# Patient Record
Sex: Male | Born: 1975 | Race: White | Hispanic: No | Marital: Married | Smoking: Current every day smoker
Health system: Southern US, Community
[De-identification: ages and names within clinical notes are randomized; demographics above are authoritative.]

## PROBLEM LIST (undated history)

## (undated) DIAGNOSIS — F419 Anxiety disorder, unspecified: Secondary | ICD-10-CM

---

## 2003-07-20 ENCOUNTER — Emergency Department (HOSPITAL_COMMUNITY): Admission: EM | Admit: 2003-07-20 | Discharge: 2003-07-20 | Payer: Self-pay | Admitting: Emergency Medicine

## 2004-10-17 ENCOUNTER — Encounter: Admission: RE | Admit: 2004-10-17 | Discharge: 2004-10-17 | Payer: Self-pay | Admitting: Allergy and Immunology

## 2005-10-12 IMAGING — CT CT PARANASAL SINUSES LIMITED
1 series · 16 of 28 positions shown, 20 images · IV contrast (agent unspecified)
Comparison: none

CLINICAL DATA: Chronic sinusitis symptoms. 
LIMITED PARANASAL SINUS CT ? NO CONTRAST:
TECHNIQUE: Selected direct coronal images were obtained through the major paranasal sinuses with no IV contrast nor comparison.

[Series 2: — · axial · 0.33mm/px · z∈[+37,+138]mm · 16 of 28 slices shown, 20 images]
[im 2/28  brain]
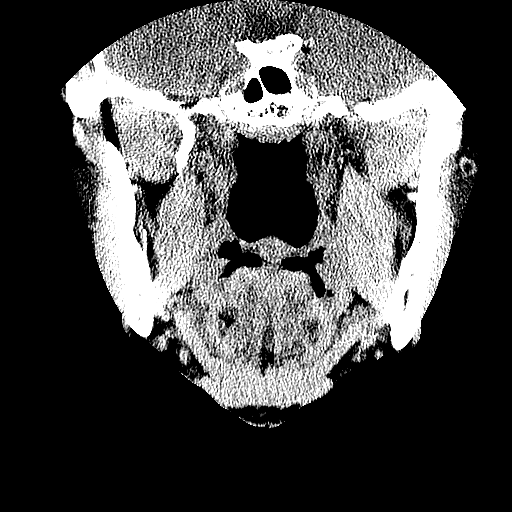
[im 2/28  bone]
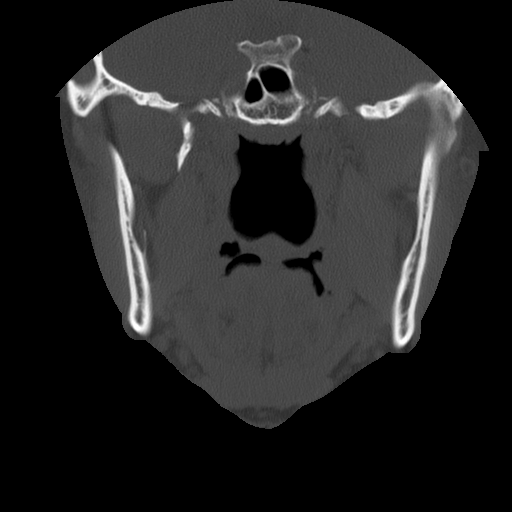
[im 4/28  bone]
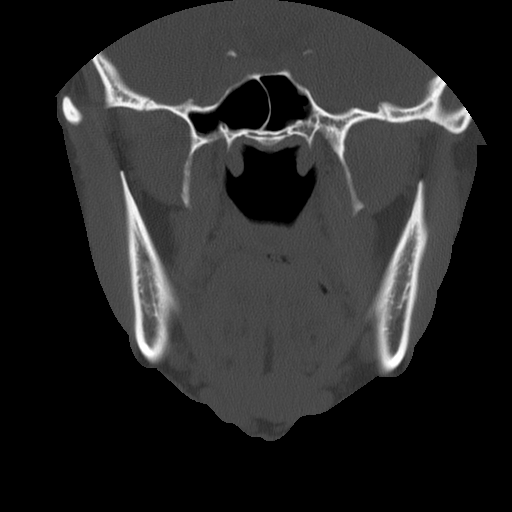
[im 6/28  bone]
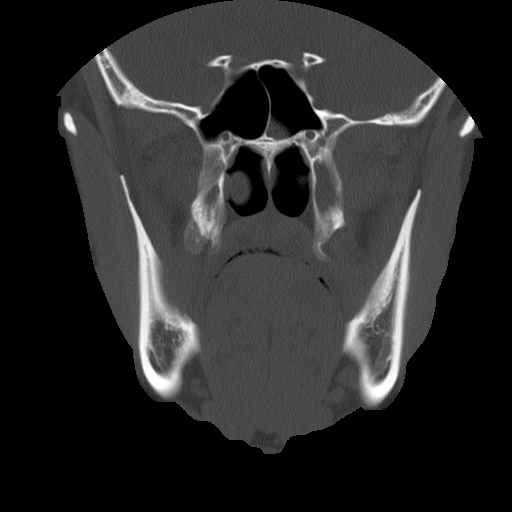
[im 7/28  bone]
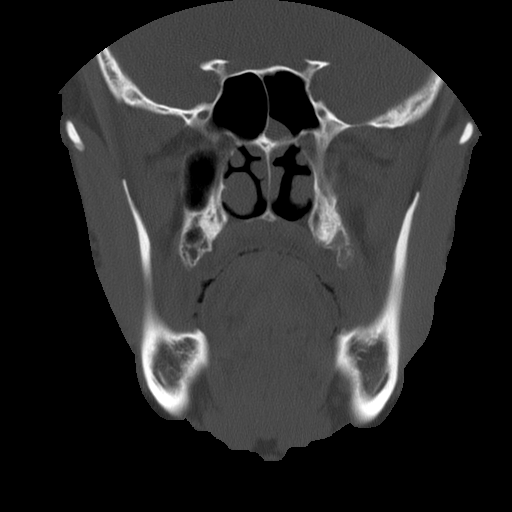
[im 9/28  brain]
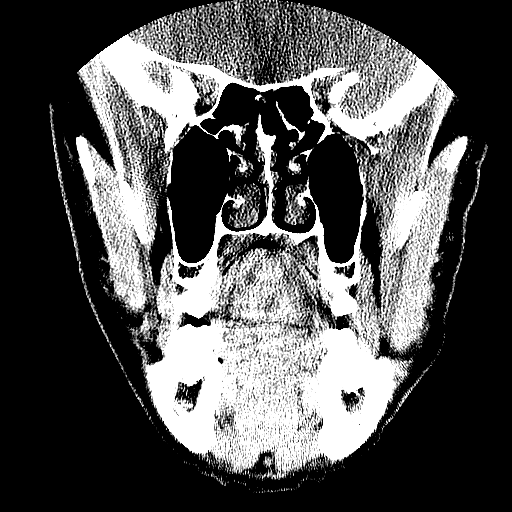
[im 9/28  bone]
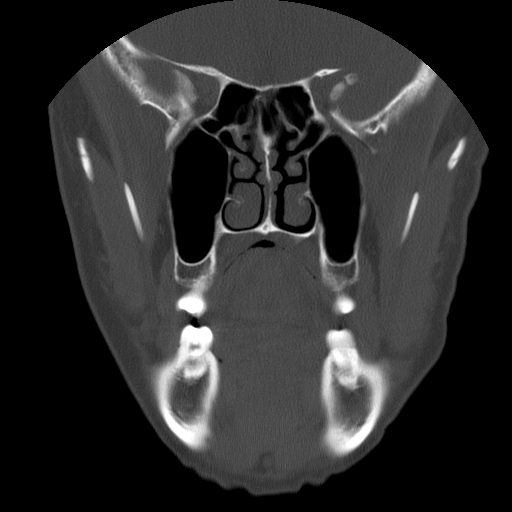
[im 10/28  bone]
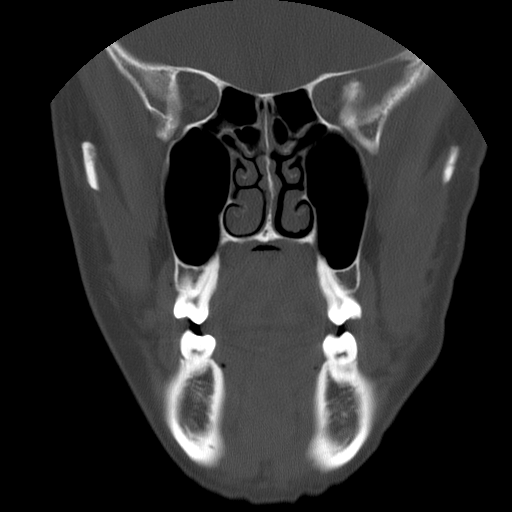
[im 12/28  bone]
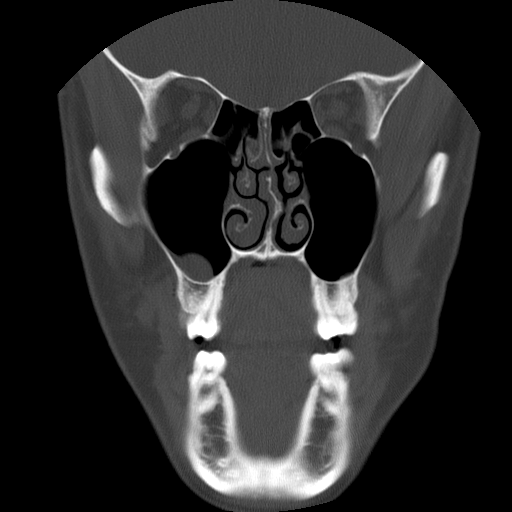
[im 14/28  bone]
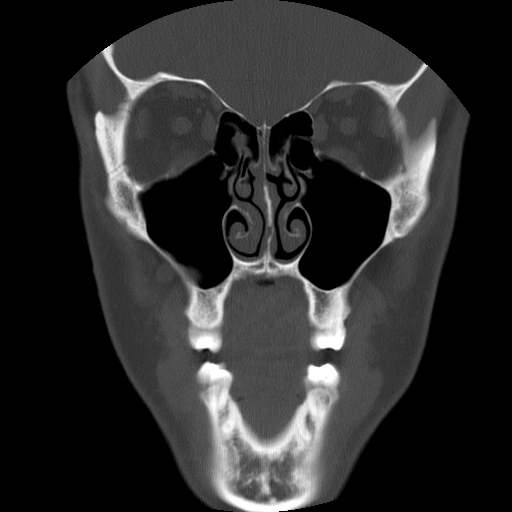
[im 15/28  brain]
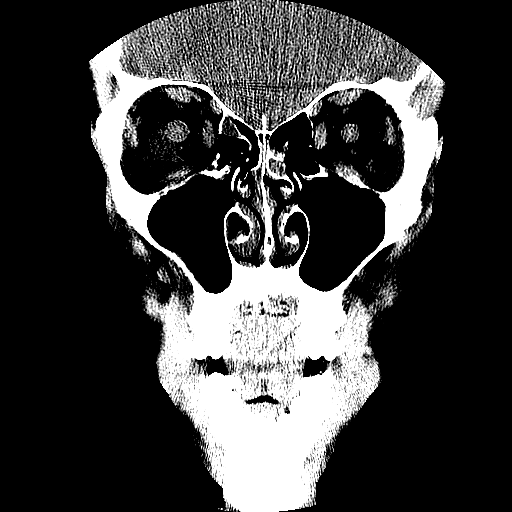
[im 15/28  bone]
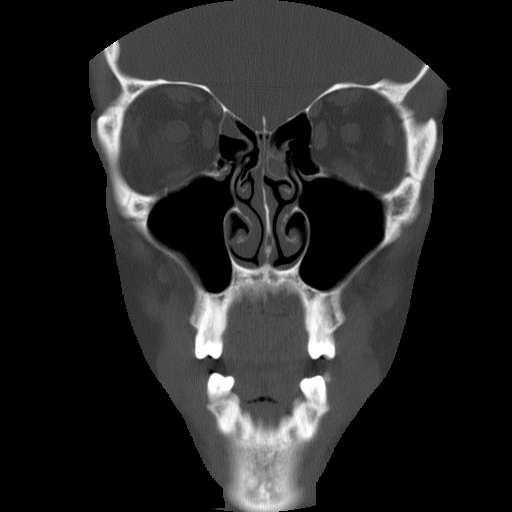
[im 17/28  bone]
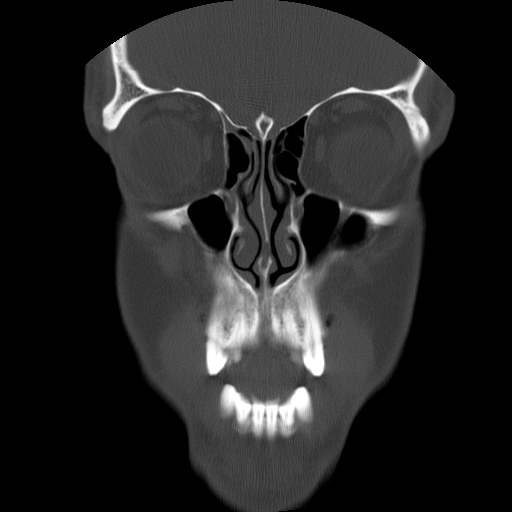
[im 19/28  bone]
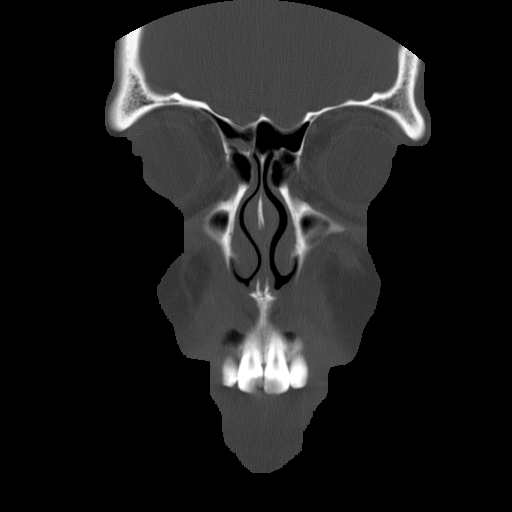
[im 20/28  bone]
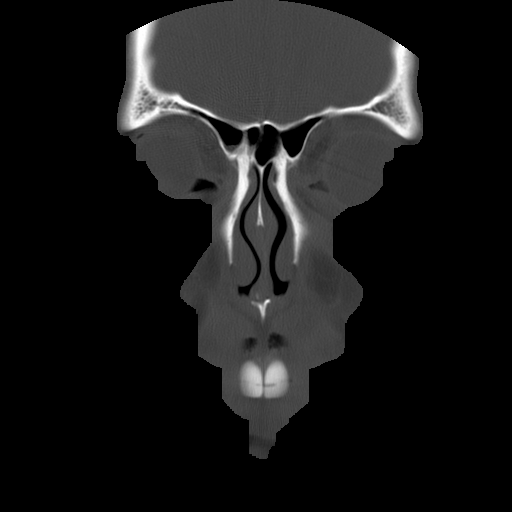
[im 22/28  brain]
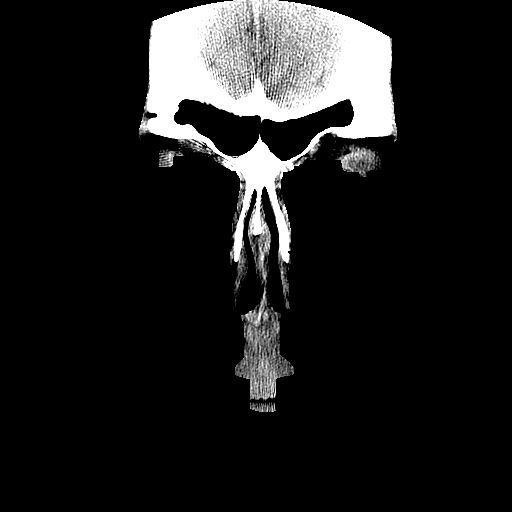
[im 22/28  bone]
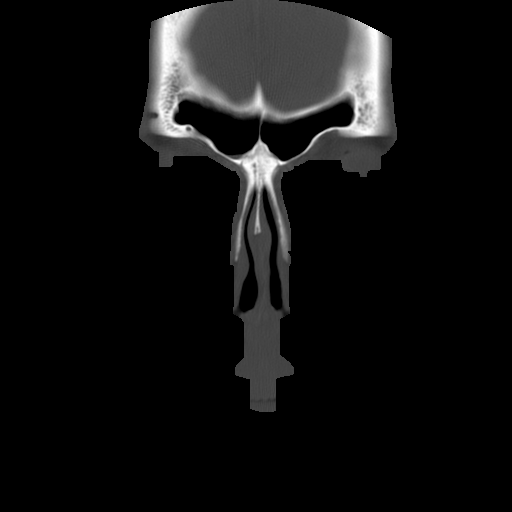
[im 23/28  bone]
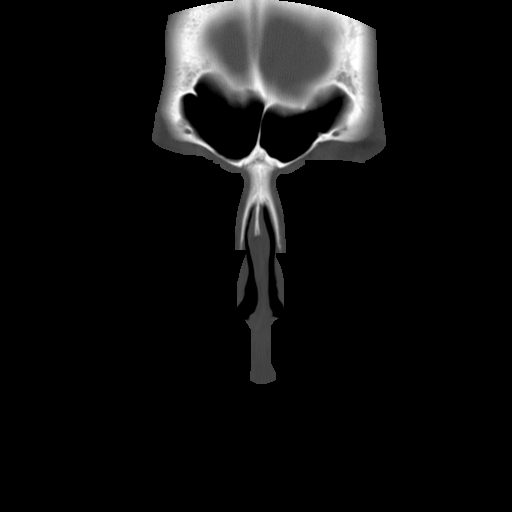
[im 25/28  bone]
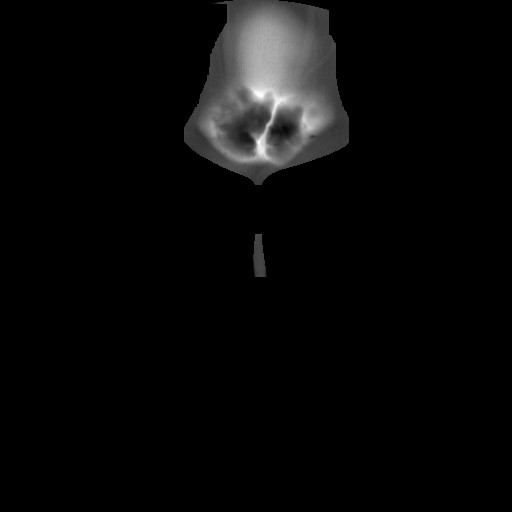
[im 27/28  bone]
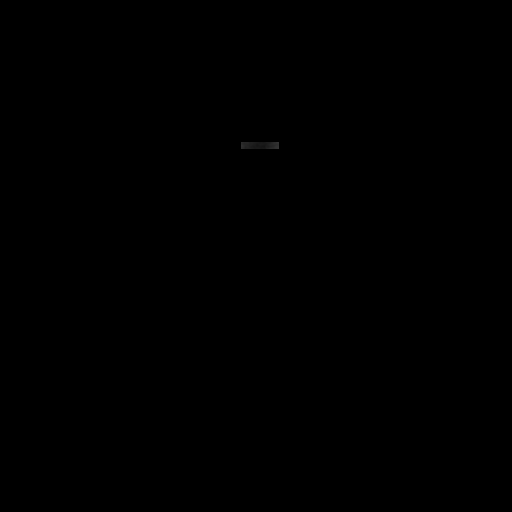

[16 of 28 positions shown; findings below may reference images not displayed]

FINDINGS: Mild inferior left frontal, bilateral ethmoid sinus mucosal thickening is seen with small subcentimeter mucous retention cysts at the inferior left maxillary and inferior right sphenoid sinuses.  The paranasal sinuses are otherwise essentially clear.  Slight rightward nasal septal deviation is seen with mild inferior nasal cavity mucosal thickening and inflammatory enlargement especially of the left inferior nasal turbinate.  No other significant abnormality is seen.
IMPRESSION: 1.  Minimal chronic paranasal sinusitis findings as described with the paranasal sinuses otherwise essentially clear.  
2.  Slight rightward nasal septal deviation with mild inferior rhinitis.  
3.  Otherwise negative.

## 2006-07-20 ENCOUNTER — Ambulatory Visit (HOSPITAL_BASED_OUTPATIENT_CLINIC_OR_DEPARTMENT_OTHER): Admission: RE | Admit: 2006-07-20 | Discharge: 2006-07-20 | Payer: Self-pay | Admitting: Otolaryngology

## 2018-05-08 ENCOUNTER — Emergency Department (HOSPITAL_BASED_OUTPATIENT_CLINIC_OR_DEPARTMENT_OTHER)
Admission: EM | Admit: 2018-05-08 | Discharge: 2018-05-09 | Disposition: A | Discharge status: Home / Self Care | Payer: BLUE CROSS/BLUE SHIELD | Attending: Emergency Medicine | Admitting: Emergency Medicine

## 2018-05-08 ENCOUNTER — Other Ambulatory Visit: Payer: Self-pay

## 2018-05-08 ENCOUNTER — Encounter (HOSPITAL_BASED_OUTPATIENT_CLINIC_OR_DEPARTMENT_OTHER): Payer: Self-pay | Admitting: *Deleted

## 2018-05-08 DIAGNOSIS — R109 Unspecified abdominal pain: Secondary | ICD-10-CM | POA: Diagnosis present

## 2018-05-08 DIAGNOSIS — T6111XA Scombroid fish poisoning, accidental (unintentional), initial encounter: Secondary | ICD-10-CM | POA: Diagnosis not present

## 2018-05-08 DIAGNOSIS — F419 Anxiety disorder, unspecified: Secondary | ICD-10-CM | POA: Insufficient documentation

## 2018-05-08 DIAGNOSIS — F17210 Nicotine dependence, cigarettes, uncomplicated: Secondary | ICD-10-CM | POA: Diagnosis not present

## 2018-05-08 HISTORY — DX: Anxiety disorder, unspecified: F41.9

## 2018-05-08 LAB — COMPREHENSIVE METABOLIC PANEL
ALBUMIN: 4.3 g/dL (ref 3.5–5.0)
ALT: 30 U/L (ref 0–44)
ANION GAP: 10 (ref 5–15)
AST: 21 U/L (ref 15–41)
Alkaline Phosphatase: 49 U/L (ref 38–126)
BUN: 25 mg/dL — ABNORMAL HIGH (ref 6–20)
CO2: 25 mmol/L (ref 22–32)
Calcium: 9.1 mg/dL (ref 8.9–10.3)
Chloride: 102 mmol/L (ref 98–111)
Creatinine, Ser: 1.08 mg/dL (ref 0.61–1.24)
GFR calc non Af Amer: >60 mL/min (ref >60)
GLUCOSE: 117 mg/dL — AB (ref 70–99)
POTASSIUM: 3.3 mmol/L — AB (ref 3.5–5.1)
SODIUM: 137 mmol/L (ref 135–145)
Total Bilirubin: 0.6 mg/dL (ref 0.3–1.2)
Total Protein: 7.5 g/dL (ref 6.5–8.1)

## 2018-05-08 LAB — CBC WITH DIFFERENTIAL/PLATELET
Abs Immature Granulocytes: 0.06 10*3/uL (ref 0.00–0.07)
BASOS ABS: 0 10*3/uL (ref 0.0–0.1)
Basophils Relative: 0 %
EOS ABS: 0.2 10*3/uL (ref 0.0–0.5)
Eosinophils Relative: 1 %
HCT: 45.7 % (ref 39.0–52.0)
Hemoglobin: 14.8 g/dL (ref 13.0–17.0)
IMMATURE GRANULOCYTES: 1 %
LYMPHS ABS: 1.7 10*3/uL (ref 0.7–4.0)
LYMPHS PCT: 14 %
MCH: 28.1 pg (ref 26.0–34.0)
MCHC: 32.4 g/dL (ref 30.0–36.0)
MCV: 86.7 fL (ref 80.0–100.0)
Monocytes Absolute: 0.9 10*3/uL (ref 0.1–1.0)
Monocytes Relative: 7 %
NEUTROS PCT: 77 %
NRBC: 0 % (ref 0.0–0.2)
Neutro Abs: 9.7 10*3/uL — ABNORMAL HIGH (ref 1.7–7.7)
Platelets: 324 10*3/uL (ref 150–400)
RBC: 5.27 MIL/uL (ref 4.22–5.81)
RDW: 13.7 % (ref 11.5–15.5)
WBC: 12.6 10*3/uL — AB (ref 4.0–10.5)

## 2018-05-08 LAB — LIPASE, BLOOD: Lipase: 29 U/L (ref 11–51)

## 2018-05-08 MED ORDER — SODIUM CHLORIDE 0.9 % IV BOLUS (SEPSIS)
1000.0000 mL | Freq: Once | INTRAVENOUS | Status: AC
  Administered 2018-05-08: 1000 mL via INTRAVENOUS

## 2018-05-08 MED ORDER — ONDANSETRON 4 MG PO TBDP: 4.0000 mg | ORAL_TABLET | ORAL | 0 refills | Status: AC | PRN

## 2018-05-08 MED ORDER — ONDANSETRON HCL 4 MG/2ML IJ SOLN
4.0000 mg | Freq: Once | INTRAMUSCULAR | Status: AC
  Administered 2018-05-08: 4 mg via INTRAVENOUS
  Filled 2018-05-08: qty 2

## 2018-05-08 MED ORDER — FAMOTIDINE 20 MG PO TABS: 20.0000 mg | ORAL_TABLET | Freq: Two times a day (BID) | ORAL | 0 refills | Status: AC

## 2018-05-08 MED ORDER — SODIUM CHLORIDE 0.9 % IV SOLN
1000.0000 mL | INTRAVENOUS | Status: DC
Start: 2018-05-08 — End: 2018-05-09

## 2018-05-08 MED ORDER — DIPHENHYDRAMINE HCL 50 MG/ML IJ SOLN
25.0000 mg | Freq: Once | INTRAMUSCULAR | Status: AC
  Administered 2018-05-08: 25 mg via INTRAVENOUS
  Filled 2018-05-08: qty 1

## 2018-05-08 MED ORDER — METHYLPREDNISOLONE SODIUM SUCC 125 MG IJ SOLR
125.0000 mg | Freq: Once | INTRAMUSCULAR | Status: AC
  Administered 2018-05-08: 125 mg via INTRAVENOUS
  Filled 2018-05-08: qty 2

## 2018-05-08 MED ORDER — FAMOTIDINE IN NACL 20-0.9 MG/50ML-% IV SOLN
20.0000 mg | Freq: Once | INTRAVENOUS | Status: AC
  Administered 2018-05-08: 20 mg via INTRAVENOUS
  Filled 2018-05-08: qty 50

## 2018-05-08 MED ORDER — DIPHENHYDRAMINE HCL 25 MG PO TABS: ORAL_TABLET | ORAL | 0 refills | Status: AC

## 2018-05-08 NOTE — ED Notes (Signed)
Patient denies itching 

## 2018-05-08 NOTE — ED Triage Notes (Signed)
Pt reports having diarrhea after eating tuna tonight and chest became very red. Sx started around 630pm. Redness is resolving at this time

## 2018-05-09 NOTE — ED Provider Notes (Signed)
[facility] EMERGENCY DEPARTMENT Provider Note   CSN: [id number] Arrival date & time: 05/08/18  2008     History   Chief Complaint Chief Complaint  Patient presents with  . Allergic Reaction    HPI Adam Small is a 42 y.o. male.  HPI Patient ate tuna this evening.  Patient's wife reports that they frequently have that at home for dinner.  She reports that they all ate tuna however there were separate tuna steaks.  Within about 30 minutes of eating the patient suddenly developed intense cramping in the abdomen with nausea and vomiting.  Started having diarrhea.  His chest and upper body became very red.  He started to feel lightheaded.  He denies difficulty breathing.  He reports he does have a general headache.  He denies any paresthesias or extremity weakness.  He denies ever having had allergic reaction to fish or anything else previously.  He was well before he ate this evening. Past Medical History:  Diagnosis Date  . Anxiety     There are no active problems to display for this patient.   History reviewed. No pertinent surgical history.      Home Medications    Prior to Admission medications   Medication Sig Start Date End Date Taking? Authorizing Provider  diphenhydrAMINE (BENADRYL) 25 MG tablet Take 1 to 2 tablets of Benadryl every 6 hours for the next 2 days.  Then take as needed. 05/08/18   Arby Barrette, MD  famotidine (PEPCID) 20 MG tablet Take 1 tablet (20 mg total) by mouth 2 (two) times daily. Take 2 times daily for the next 2 weeks. 05/08/18   Arby Barrette, MD  ondansetron (ZOFRAN ODT) 4 MG disintegrating tablet Take 1 tablet (4 mg total) by mouth every 4 (four) hours as needed for nausea or vomiting. 05/08/18   Arby Barrette, MD    Family History No family history on file.  Social History Social History   Tobacco Use  . Smoking status: Current Every Day Smoker    Types: Cigarettes  . Smokeless tobacco: Current User    Types: Snuff    Substance Use Topics  . Alcohol use: Yes    Comment: rare  . Drug use: Never     Allergies   Codeine and Naproxen   Review of Systems Review of Systems 10 Systems reviewed and are negative for acute change except as noted in the HPI.   Physical Exam Updated Vital Signs BP 104/69   Pulse 83   Temp 98.1 F (36.7 C) (Oral)   Resp 20   Ht 6\' 2"  (1.88 m)   Wt 104.2 kg   SpO2 93%   BMI 29.49 kg/m   Physical Exam  Constitutional: He is oriented to person, place, and time.  Patient is alert and appropriate.  He appears uncomfortable and mildly ill.  He is slightly pale.  HENT:  Head: Normocephalic and atraumatic.  Mouth/Throat: Oropharynx is clear and moist.  Eyes: Conjunctivae and EOM are normal.  Neck: Neck supple.  Cardiovascular: Normal rate, regular rhythm, normal heart sounds and intact distal pulses.  Pulmonary/Chest: Effort normal and breath sounds normal.  Abdominal:  Abdomen is soft.  Patient denies pain but reports that palpation makes him feel more nauseated.  Musculoskeletal: Normal range of motion. He exhibits no edema or tenderness.  Neurological: He is alert and oriented to person, place, and time. No cranial nerve deficit. He exhibits normal muscle tone. Coordination normal.  Skin: Skin is warm  and dry.  Psychiatric: He has a normal mood and affect.     ED Treatments / Results  Labs (all labs ordered are listed, but only abnormal results are displayed) Labs Reviewed  COMPREHENSIVE METABOLIC PANEL - Abnormal; Notable for the following components:      Result Value   Potassium 3.3 (*)    Glucose, Bld 117 (*)    BUN 25 (*)    All other components within normal limits  CBC WITH DIFFERENTIAL/PLATELET - Abnormal; Notable for the following components:   WBC 12.6 (*)    Neutro Abs 9.7 (*)    All other components within normal limits  LIPASE, BLOOD    EKG None  Radiology No results found.  Procedures Procedures (including critical care  time)  Medications Ordered in ED Medications  sodium chloride 0.9 % bolus 1,000 mL (0 mLs Intravenous Stopped 05/08/18 2230)    Followed by  sodium chloride 0.9 % bolus 1,000 mL (0 mLs Intravenous Stopped 05/09/18 0003)    Followed by  0.9 %  sodium chloride infusion (has no administration in time range)  ondansetron (ZOFRAN) injection 4 mg (4 mg Intravenous Given 05/08/18 2142)  diphenhydrAMINE (BENADRYL) injection 25 mg (25 mg Intravenous Given 05/08/18 2143)  methylPREDNISolone sodium succinate (SOLU-MEDROL) 125 mg/2 mL injection 125 mg (125 mg Intravenous Given 05/08/18 2142)  diphenhydrAMINE (BENADRYL) injection 25 mg (25 mg Intravenous Given 05/08/18 2230)  famotidine (PEPCID) IVPB 20 mg premix (0 mg Intravenous Stopped 05/09/18 0003)     Initial Impression / Assessment and Plan / ED Course  I have reviewed the triage vital signs and the nursing notes.  Pertinent labs & imaging results that were available during my care of the patient were reviewed by me and considered in my medical decision making (see chart for details).    Patient did not have a rash at the time that I saw him.  It had already begun to improve significantly.  He however was having recurrent episodes of intense nausea and cramping abdominal pain having to go to the bathroom.  He felt like he had a generalized headache and felt unwell.  Vital signs stable.  Patient had just eaten a tuna steak at home before the symptoms started.  Findings very consistent with scombroid poisoning.  Patient was treated with Benadryl and Solu-Medrol and Zofran.  Rehydrated with 2 L of fluids.  Patient felt much better upon reassessment.  He wished to go home once symptoms had abated.  He is counseled on return precautions.  Final Clinical Impressions(s) / ED Diagnoses   Final diagnoses:  Unintentional scombroid fish poisoning, initial encounter    ED Discharge Orders         Ordered    diphenhydrAMINE (BENADRYL) 25 MG tablet      05/08/18 2359    famotidine (PEPCID) 20 MG tablet  2 times daily     05/08/18 2359    ondansetron (ZOFRAN ODT) 4 MG disintegrating tablet  Every 4 hours PRN     05/08/18 2359           Arby BarrettePfeiffer, Myisha Pickerel, MD 05/09/18 0007

## 2021-09-04 ENCOUNTER — Encounter (HOSPITAL_BASED_OUTPATIENT_CLINIC_OR_DEPARTMENT_OTHER): Payer: Self-pay | Admitting: Emergency Medicine

## 2021-09-04 ENCOUNTER — Emergency Department (HOSPITAL_BASED_OUTPATIENT_CLINIC_OR_DEPARTMENT_OTHER)
Admission: EM | Admit: 2021-09-04 | Discharge: 2021-09-04 | Disposition: A | Discharge status: Home / Self Care | Payer: BC Managed Care – PPO | Attending: Emergency Medicine | Admitting: Emergency Medicine

## 2021-09-04 ENCOUNTER — Other Ambulatory Visit: Payer: Self-pay

## 2021-09-04 ENCOUNTER — Emergency Department (HOSPITAL_BASED_OUTPATIENT_CLINIC_OR_DEPARTMENT_OTHER): Payer: BC Managed Care – PPO

## 2021-09-04 DIAGNOSIS — R079 Chest pain, unspecified: Secondary | ICD-10-CM

## 2021-09-04 DIAGNOSIS — R42 Dizziness and giddiness: Secondary | ICD-10-CM | POA: Diagnosis not present

## 2021-09-04 DIAGNOSIS — R002 Palpitations: Secondary | ICD-10-CM | POA: Diagnosis not present

## 2021-09-04 DIAGNOSIS — R0789 Other chest pain: Secondary | ICD-10-CM | POA: Diagnosis not present

## 2021-09-04 DIAGNOSIS — F419 Anxiety disorder, unspecified: Secondary | ICD-10-CM | POA: Diagnosis not present

## 2021-09-04 DIAGNOSIS — R059 Cough, unspecified: Secondary | ICD-10-CM | POA: Insufficient documentation

## 2021-09-04 LAB — BASIC METABOLIC PANEL
Anion gap: 8 (ref 5–15)
BUN: 16 mg/dL (ref 6–20)
CO2: 26 mmol/L (ref 22–32)
Calcium: 8.8 mg/dL — ABNORMAL LOW (ref 8.9–10.3)
Chloride: 100 mmol/L (ref 98–111)
Creatinine, Ser: 1.07 mg/dL (ref 0.61–1.24)
GFR, Estimated: >60 mL/min (ref >60)
Glucose, Bld: 93 mg/dL (ref 70–99)
Potassium: 3.9 mmol/L (ref 3.5–5.1)
Sodium: 134 mmol/L — ABNORMAL LOW (ref 135–145)

## 2021-09-04 LAB — CBC
HCT: 46.7 % (ref 39.0–52.0)
Hemoglobin: 15.6 g/dL (ref 13.0–17.0)
MCH: 28.9 pg (ref 26.0–34.0)
MCHC: 33.4 g/dL (ref 30.0–36.0)
MCV: 86.5 fL (ref 80.0–100.0)
Platelets: 361 10*3/uL (ref 150–400)
RBC: 5.4 MIL/uL (ref 4.22–5.81)
RDW: 14 % (ref 11.5–15.5)
WBC: 6.8 10*3/uL (ref 4.0–10.5)
nRBC: 0 % (ref 0.0–0.2)

## 2021-09-04 LAB — TROPONIN I (HIGH SENSITIVITY)
Troponin I (High Sensitivity): <2 ng/L (ref <18)
Troponin I (High Sensitivity): <2 ng/L (ref <18)

## 2021-09-04 NOTE — ED Provider Notes (Signed)
?[facility] EMERGENCY DEPARTMENT ?Provider Note ? ? ?CSN: [id number] ?Arrival date & time: 09/04/21  1151 ? ?  ? ?History ? ?Chief Complaint  ?Patient presents with  ? Chest Pain  ? ? ?JONATHANDAVID MUNKRES is a 46 y.o. male with a past medical history of anxiety presenting today with some chest discomfort.  Says that it felt like "fluttering" in his chest.  Says there was some associated minor pain.  Reports that this usually happens during his anxiety attacks however after 30 minutes the pain was not resolved so he came to the emergency department.  No history of hypertension, hyperlipidemia, does not smoke, no cardiac history in his family.  Reports taking duloxetine every day and Klonopin every night for anxiety.  Denies any associated shortness of breath.  No history of ACS.  The discomfort did not radiate anywhere and he is currently asymptomatic. ? ? ?Chest Pain ?Associated symptoms: cough, dizziness and palpitations   ?Associated symptoms: no nausea and no vomiting   ? ?  ? ?Home Medications ?Prior to Admission medications   ?Medication Sig Start Date End Date Taking? Authorizing Provider  ?diphenhydrAMINE (BENADRYL) 25 MG tablet Take 1 to 2 tablets of Benadryl every 6 hours for the next 2 days.  Then take as needed. 05/08/18   Charlesetta Shanks, MD  ?famotidine (PEPCID) 20 MG tablet Take 1 tablet (20 mg total) by mouth 2 (two) times daily. Take 2 times daily for the next 2 weeks. 05/08/18   Charlesetta Shanks, MD  ?ondansetron (ZOFRAN ODT) 4 MG disintegrating tablet Take 1 tablet (4 mg total) by mouth every 4 (four) hours as needed for nausea or vomiting. 05/08/18   Charlesetta Shanks, MD  ?   ? ?Allergies    ?Codeine and Naproxen   ? ?Review of Systems   ?Review of Systems  ?Respiratory:  Positive for cough and chest tightness.   ?Cardiovascular:  Positive for palpitations. Negative for chest pain.  ?Gastrointestinal:  Negative for nausea and vomiting.  ?Musculoskeletal:  Negative for gait problem.  ?Neurological:   Positive for dizziness.  ?See HPI ? ?Physical Exam ?Updated Vital Signs ?BP 123/77   Pulse 66   Temp 97.9 ?F (36.6 ?C) (Oral)   Resp 19   Ht 6\' 2"  (1.88 m)   Wt 115.7 kg   SpO2 98%   BMI 32.74 kg/m?  ?Physical Exam ?Vitals and nursing note reviewed.  ?Constitutional:   ?   General: He is not in acute distress. ?   Appearance: Normal appearance. He is not ill-appearing.  ?HENT:  ?   Head: Normocephalic and atraumatic.  ?Eyes:  ?   General: No scleral icterus. ?   Conjunctiva/sclera: Conjunctivae normal.  ?Cardiovascular:  ?   Rate and Rhythm: Normal rate and regular rhythm.  ?   Heart sounds: Normal heart sounds.  ?Pulmonary:  ?   Effort: Pulmonary effort is normal. No respiratory distress.  ?   Breath sounds: Normal breath sounds. No wheezing or rhonchi.  ?Skin: ?   General: Skin is warm and dry.  ?   Findings: No rash.  ?Neurological:  ?   Mental Status: He is alert.  ?Psychiatric:     ?   Mood and Affect: Mood normal.  ? ? ?ED Results / Procedures / Treatments   ?Labs ?(all labs ordered are listed, but only abnormal results are displayed) ?Labs Reviewed  ?BASIC METABOLIC PANEL - Abnormal; Notable for the following components:  ?    Result Value  ?  Sodium 134 (*)   ? Calcium 8.8 (*)   ? All other components within normal limits  ?CBC  ?TROPONIN I (HIGH SENSITIVITY)  ?TROPONIN I (HIGH SENSITIVITY)  ? ? ?EKG ?EKG Interpretation ? ?Date/Time:  Wednesday September 04 2021 12:03:23 EDT ?Ventricular Rate:  62 ?PR Interval:  134 ?QRS Duration: 92 ?QT Interval:  408 ?QTC Calculation: 414 ?R Axis:   -18 ?Text Interpretation: Sinus rhythm with marked sinus arrhythmia Cannot rule out Anterior infarct , age undetermined Abnormal ECG No previous ECGs available Confirmed by Gareth Morgan [phone]) on 09/04/2021 3:18:33 PM ? ?Radiology ?DG Chest 2 View ? ?Result Date: 09/04/2021 ?CLINICAL DATA:  Central chest pain beginning today with bilateral arm tingling EXAM: CHEST - 2 VIEW COMPARISON:  None FINDINGS: Cardiomediastinal  silhouette and pulmonary vasculature are within normal limits. Lungs are clear. IMPRESSION: No acute cardiopulmonary process. Electronically Signed   By: Miachel Roux M.D.   On: 09/04/2021 12:44   ? ?Procedures ?Procedures  ? ? ?Medications Ordered in ED ?Medications - No data to display ? ?ED Course/ Medical Decision Making/ A&P ?  ?                        ?Medical Decision Making ?Amount and/or Complexity of Data Reviewed ?Labs: ordered. ?Radiology: ordered. ? ? ?Patient presents to the ED for concern of chest pain. The emergent differential diagnosis of chest pain includes: Acute coronary syndrome, pericarditis, aortic dissection, pulmonary embolism, tension pneumothorax, and esophageal rupture. ?  ?Co morbidities that complicate the patient evaluation include: Anxiety ? ?Per internal/external chart review: Patient has no cardiac related visits or comorbidities. ? ? ?I performed a full physical exam, pertinent findings include: ? ?No pertinent findings ? ?Diagnostics: ? ?I ordered and viewed labs. The pertinent results include:  ?Negative troponin x2 ?Considered D-dimer however patient is PERC negative. ? ?I ordered and individually viewed patient's chest x-ray.  Negative. ? ? ?Cardiac Monitoring: ? ?The patient was maintained on a cardiac monitor.  I personally viewed and interpreted the cardiac monitored which showed normal sinus rhythm with a normal rate ? ? ?Treatment: ? ?Asymptomatic in the department, no medications ordered. ? ? ?MDM/Disposition: ? ? ? ?I reevaluated the patient and he continues to deny any symptoms.  Heart score 0.  I believe patient is stable to be discharged home with follow-up with his PCP.  He is agreeable to this plan.  If he continues to have these symptoms after his anxiety is under control, he may require referral to cardiology.  Will defer to PCP.  Patient is agreeable to this plan ? ? ?Final Clinical Impression(s) / ED Diagnoses ?Final diagnoses:  ?Nonspecific chest pain   ?Anxiety  ? ? ?Rx / DC Orders ? ? ?Results and diagnoses were explained to the patient. Return precautions discussed in full. Patient had no additional questions and expressed complete understanding. ? ? ?This chart was dictated using voice recognition software.  Despite best efforts to proofread,  errors can occur which can change the documentation meaning.  ?  ?Rhae Hammock, PA-C ?09/05/21 1800 ? ?  ?Gareth Morgan, MD ?09/05/21 2221 ? ?

## 2021-09-04 NOTE — ED Provider Notes (Incomplete)
?  [facility] EMERGENCY DEPARTMENT ?Provider Note ? ? ?CSN: [id number] ?Arrival date & time: 09/04/21  1151 ? ?  ? ?History ?{Add pertinent medical, surgical, social history, OB history to HPI:1} ?Chief Complaint  ?Patient presents with  ? Chest Pain  ? ? ?Adam Small is a 46 y.o. male. ? ?11:15 am racing heart, panic attacks ysyally go away ?-duloxetine, 30 ?-clonazopem  ?-cpap ? ? ?Chest Pain ? ?  ? ?Home Medications ?Prior to Admission medications   ?Medication Sig Start Date End Date Taking? Authorizing Provider  ?diphenhydrAMINE (BENADRYL) 25 MG tablet Take 1 to 2 tablets of Benadryl every 6 hours for the next 2 days.  Then take as needed. 05/08/18   Arby Barrette, MD  ?famotidine (PEPCID) 20 MG tablet Take 1 tablet (20 mg total) by mouth 2 (two) times daily. Take 2 times daily for the next 2 weeks. 05/08/18   Arby Barrette, MD  ?ondansetron (ZOFRAN ODT) 4 MG disintegrating tablet Take 1 tablet (4 mg total) by mouth every 4 (four) hours as needed for nausea or vomiting. 05/08/18   Arby Barrette, MD  ?   ? ?Allergies    ?Codeine and Naproxen   ? ?Review of Systems   ?Review of Systems  ?Cardiovascular:  Positive for chest pain.  ? ?Physical Exam ?Updated Vital Signs ?BP (!) 141/87 (BP Location: Left Arm)   Pulse 71   Temp 97.9 ?F (36.6 ?C) (Oral)   Resp 18   Ht 6\' 2"  (1.88 m)   Wt 115.7 kg   SpO2 100%   BMI 32.74 kg/m?  ?Physical Exam ? ?ED Results / Procedures / Treatments   ?Labs ?(all labs ordered are listed, but only abnormal results are displayed) ?Labs Reviewed  ?BASIC METABOLIC PANEL - Abnormal; Notable for the following components:  ?    Result Value  ? Sodium 134 (*)   ? Calcium 8.8 (*)   ? All other components within normal limits  ?CBC  ?TROPONIN I (HIGH SENSITIVITY)  ?TROPONIN I (HIGH SENSITIVITY)  ? ? ?EKG ?None ? ?Radiology ?DG Chest 2 View ? ?Result Date: 09/04/2021 ?CLINICAL DATA:  Central chest pain beginning today with bilateral arm tingling EXAM: CHEST - 2 VIEW COMPARISON:   None FINDINGS: Cardiomediastinal silhouette and pulmonary vasculature are within normal limits. Lungs are clear. IMPRESSION: No acute cardiopulmonary process. Electronically Signed   By: 11/04/2021 M.D.   On: 09/04/2021 12:44   ? ?Procedures ?Procedures  ?{Document cardiac monitor, telemetry assessment procedure when appropriate:1} ? ?Medications Ordered in ED ?Medications - No data to display ? ?ED Course/ Medical Decision Making/ A&P ?  ?                        ?Medical Decision Making ?Amount and/or Complexity of Data Reviewed ?Labs: ordered. ?Radiology: ordered. ? ? ?*** ? ?{Document critical care time when appropriate:1} ?{Document review of labs and clinical decision tools ie heart score, Chads2Vasc2 etc:1}  ?{Document your independent review of radiology images, and any outside records:1} ?{Document your discussion with family members, caretakers, and with consultants:1} ?{Document social determinants of health affecting pt's care:1} ?{Document your decision making why or why not admission, treatments were needed:1} ?Final Clinical Impression(s) / ED Diagnoses ?Final diagnoses:  ?None  ? ? ?Rx / DC Orders ?ED Discharge Orders   ? ? None  ? ?  ? ? ?

## 2021-09-04 NOTE — Discharge Instructions (Signed)
Both of your labs are negative.  Your EKG is okay, please follow-up with primary care.  There is a referral to [place] on these papers.  Continue to take your anxiety medications and return with any worsening symptoms ?

## 2021-09-04 NOTE — ED Triage Notes (Signed)
Central chest pain started today , tingling to bilateral arms , Hx anxiety . No relief despite his meds.  ?Shortness of breath and lightheadedness.  ?

## 2021-09-24 DIAGNOSIS — F411 Generalized anxiety disorder: Secondary | ICD-10-CM | POA: Diagnosis not present

## 2021-09-24 DIAGNOSIS — F341 Dysthymic disorder: Secondary | ICD-10-CM | POA: Diagnosis not present

## 2021-09-24 DIAGNOSIS — G4700 Insomnia, unspecified: Secondary | ICD-10-CM | POA: Diagnosis not present

## 2021-10-17 DIAGNOSIS — G4733 Obstructive sleep apnea (adult) (pediatric): Secondary | ICD-10-CM | POA: Diagnosis not present

## 2021-12-19 DIAGNOSIS — G4733 Obstructive sleep apnea (adult) (pediatric): Secondary | ICD-10-CM | POA: Diagnosis not present

## 2021-12-24 DIAGNOSIS — F411 Generalized anxiety disorder: Secondary | ICD-10-CM | POA: Diagnosis not present

## 2021-12-24 DIAGNOSIS — F341 Dysthymic disorder: Secondary | ICD-10-CM | POA: Diagnosis not present

## 2021-12-24 DIAGNOSIS — Z563 Stressful work schedule: Secondary | ICD-10-CM | POA: Diagnosis not present

## 2021-12-24 DIAGNOSIS — Z566 Other physical and mental strain related to work: Secondary | ICD-10-CM | POA: Diagnosis not present

## 2022-01-17 DIAGNOSIS — G4733 Obstructive sleep apnea (adult) (pediatric): Secondary | ICD-10-CM | POA: Diagnosis not present

## 2022-04-17 DIAGNOSIS — G4733 Obstructive sleep apnea (adult) (pediatric): Secondary | ICD-10-CM | POA: Diagnosis not present

## 2022-04-28 DIAGNOSIS — M545 Low back pain, unspecified: Secondary | ICD-10-CM | POA: Diagnosis not present

## 2022-04-28 DIAGNOSIS — S838X2A Sprain of other specified parts of left knee, initial encounter: Secondary | ICD-10-CM | POA: Diagnosis not present

## 2022-05-09 DIAGNOSIS — M25561 Pain in right knee: Secondary | ICD-10-CM | POA: Diagnosis not present

## 2022-05-09 DIAGNOSIS — M1711 Unilateral primary osteoarthritis, right knee: Secondary | ICD-10-CM | POA: Diagnosis not present

## 2022-05-13 DIAGNOSIS — F331 Major depressive disorder, recurrent, moderate: Secondary | ICD-10-CM | POA: Diagnosis not present

## 2022-05-13 DIAGNOSIS — F411 Generalized anxiety disorder: Secondary | ICD-10-CM | POA: Diagnosis not present

## 2022-05-13 DIAGNOSIS — F410 Panic disorder [episodic paroxysmal anxiety] without agoraphobia: Secondary | ICD-10-CM | POA: Diagnosis not present

## 2022-06-10 DIAGNOSIS — R262 Difficulty in walking, not elsewhere classified: Secondary | ICD-10-CM | POA: Diagnosis not present

## 2022-06-10 DIAGNOSIS — M5440 Lumbago with sciatica, unspecified side: Secondary | ICD-10-CM | POA: Diagnosis not present

## 2022-06-10 DIAGNOSIS — M6281 Muscle weakness (generalized): Secondary | ICD-10-CM | POA: Diagnosis not present

## 2022-06-10 DIAGNOSIS — M25561 Pain in right knee: Secondary | ICD-10-CM | POA: Diagnosis not present

## 2022-06-17 DIAGNOSIS — G4733 Obstructive sleep apnea (adult) (pediatric): Secondary | ICD-10-CM | POA: Diagnosis not present

## 2022-06-24 DIAGNOSIS — M25561 Pain in right knee: Secondary | ICD-10-CM | POA: Diagnosis not present

## 2022-06-24 DIAGNOSIS — M5440 Lumbago with sciatica, unspecified side: Secondary | ICD-10-CM | POA: Diagnosis not present

## 2022-06-24 DIAGNOSIS — R262 Difficulty in walking, not elsewhere classified: Secondary | ICD-10-CM | POA: Diagnosis not present

## 2022-06-24 DIAGNOSIS — M6281 Muscle weakness (generalized): Secondary | ICD-10-CM | POA: Diagnosis not present

## 2022-07-10 DIAGNOSIS — M6281 Muscle weakness (generalized): Secondary | ICD-10-CM | POA: Diagnosis not present

## 2022-07-10 DIAGNOSIS — M25561 Pain in right knee: Secondary | ICD-10-CM | POA: Diagnosis not present

## 2022-07-10 DIAGNOSIS — R262 Difficulty in walking, not elsewhere classified: Secondary | ICD-10-CM | POA: Diagnosis not present

## 2022-07-10 DIAGNOSIS — M5440 Lumbago with sciatica, unspecified side: Secondary | ICD-10-CM | POA: Diagnosis not present

## 2022-07-14 DIAGNOSIS — H1032 Unspecified acute conjunctivitis, left eye: Secondary | ICD-10-CM | POA: Diagnosis not present

## 2022-07-14 DIAGNOSIS — H00019 Hordeolum externum unspecified eye, unspecified eyelid: Secondary | ICD-10-CM | POA: Diagnosis not present

## 2022-07-16 DIAGNOSIS — G4733 Obstructive sleep apnea (adult) (pediatric): Secondary | ICD-10-CM | POA: Diagnosis not present

## 2022-07-17 DIAGNOSIS — M6281 Muscle weakness (generalized): Secondary | ICD-10-CM | POA: Diagnosis not present

## 2022-07-17 DIAGNOSIS — M5440 Lumbago with sciatica, unspecified side: Secondary | ICD-10-CM | POA: Diagnosis not present

## 2022-07-17 DIAGNOSIS — R262 Difficulty in walking, not elsewhere classified: Secondary | ICD-10-CM | POA: Diagnosis not present

## 2022-07-17 DIAGNOSIS — M25561 Pain in right knee: Secondary | ICD-10-CM | POA: Diagnosis not present

## 2022-07-24 DIAGNOSIS — M25561 Pain in right knee: Secondary | ICD-10-CM | POA: Diagnosis not present

## 2022-07-24 DIAGNOSIS — M5440 Lumbago with sciatica, unspecified side: Secondary | ICD-10-CM | POA: Diagnosis not present

## 2022-07-24 DIAGNOSIS — M6281 Muscle weakness (generalized): Secondary | ICD-10-CM | POA: Diagnosis not present

## 2022-07-24 DIAGNOSIS — R262 Difficulty in walking, not elsewhere classified: Secondary | ICD-10-CM | POA: Diagnosis not present

## 2022-07-31 DIAGNOSIS — M25561 Pain in right knee: Secondary | ICD-10-CM | POA: Diagnosis not present

## 2022-07-31 DIAGNOSIS — M5440 Lumbago with sciatica, unspecified side: Secondary | ICD-10-CM | POA: Diagnosis not present

## 2022-07-31 DIAGNOSIS — M6281 Muscle weakness (generalized): Secondary | ICD-10-CM | POA: Diagnosis not present

## 2022-07-31 DIAGNOSIS — R262 Difficulty in walking, not elsewhere classified: Secondary | ICD-10-CM | POA: Diagnosis not present

## 2022-08-12 DIAGNOSIS — M62830 Muscle spasm of back: Secondary | ICD-10-CM | POA: Diagnosis not present

## 2022-08-12 DIAGNOSIS — M545 Low back pain, unspecified: Secondary | ICD-10-CM | POA: Diagnosis not present

## 2022-08-30 IMAGING — CR DG CHEST 2V
2 series · 2 of 2 positions shown · non-contrast
Comparison: None

CLINICAL DATA: Central chest pain beginning today with bilateral
arm tingling

EXAM:
CHEST - 2 VIEW

[w chest pa]
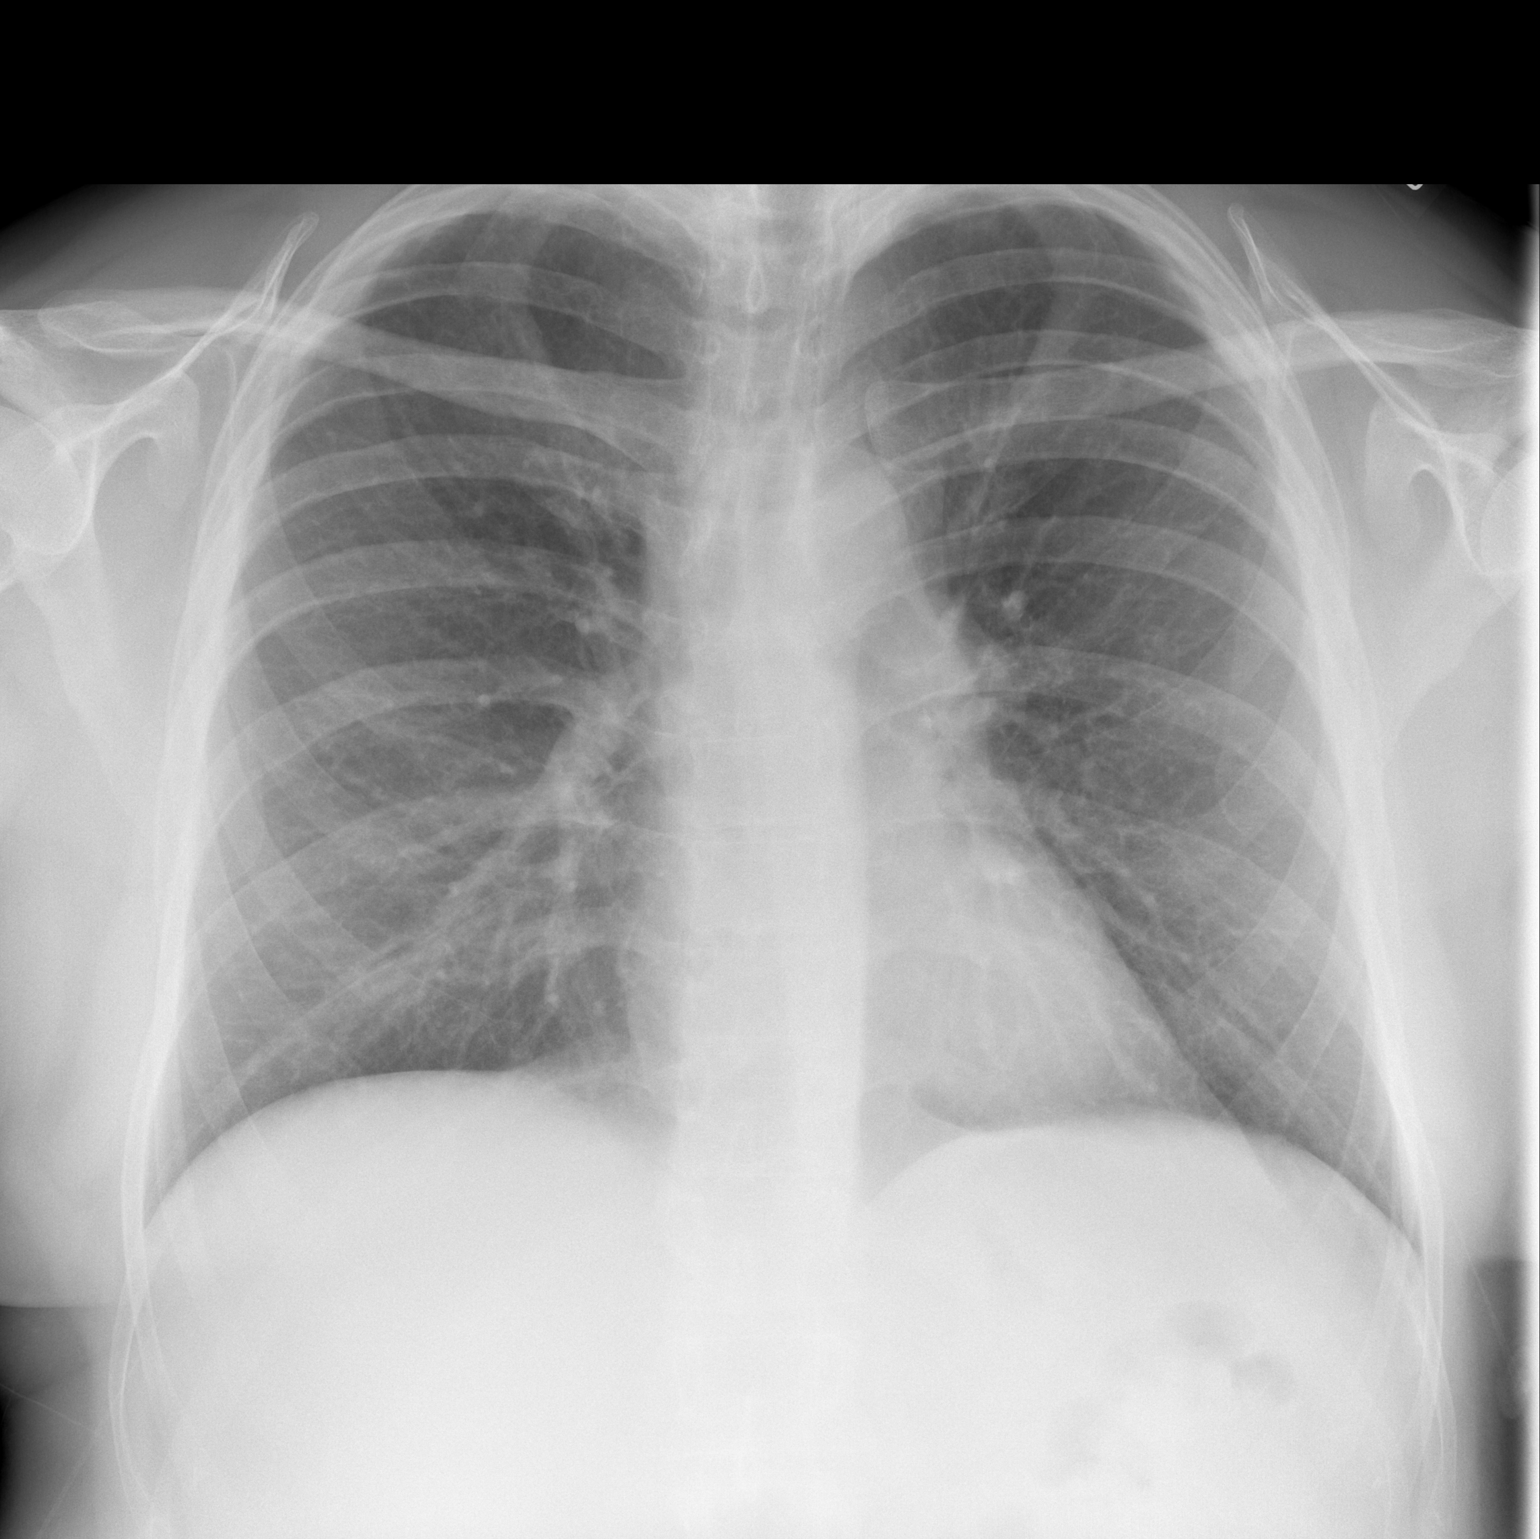

[w chest lat]
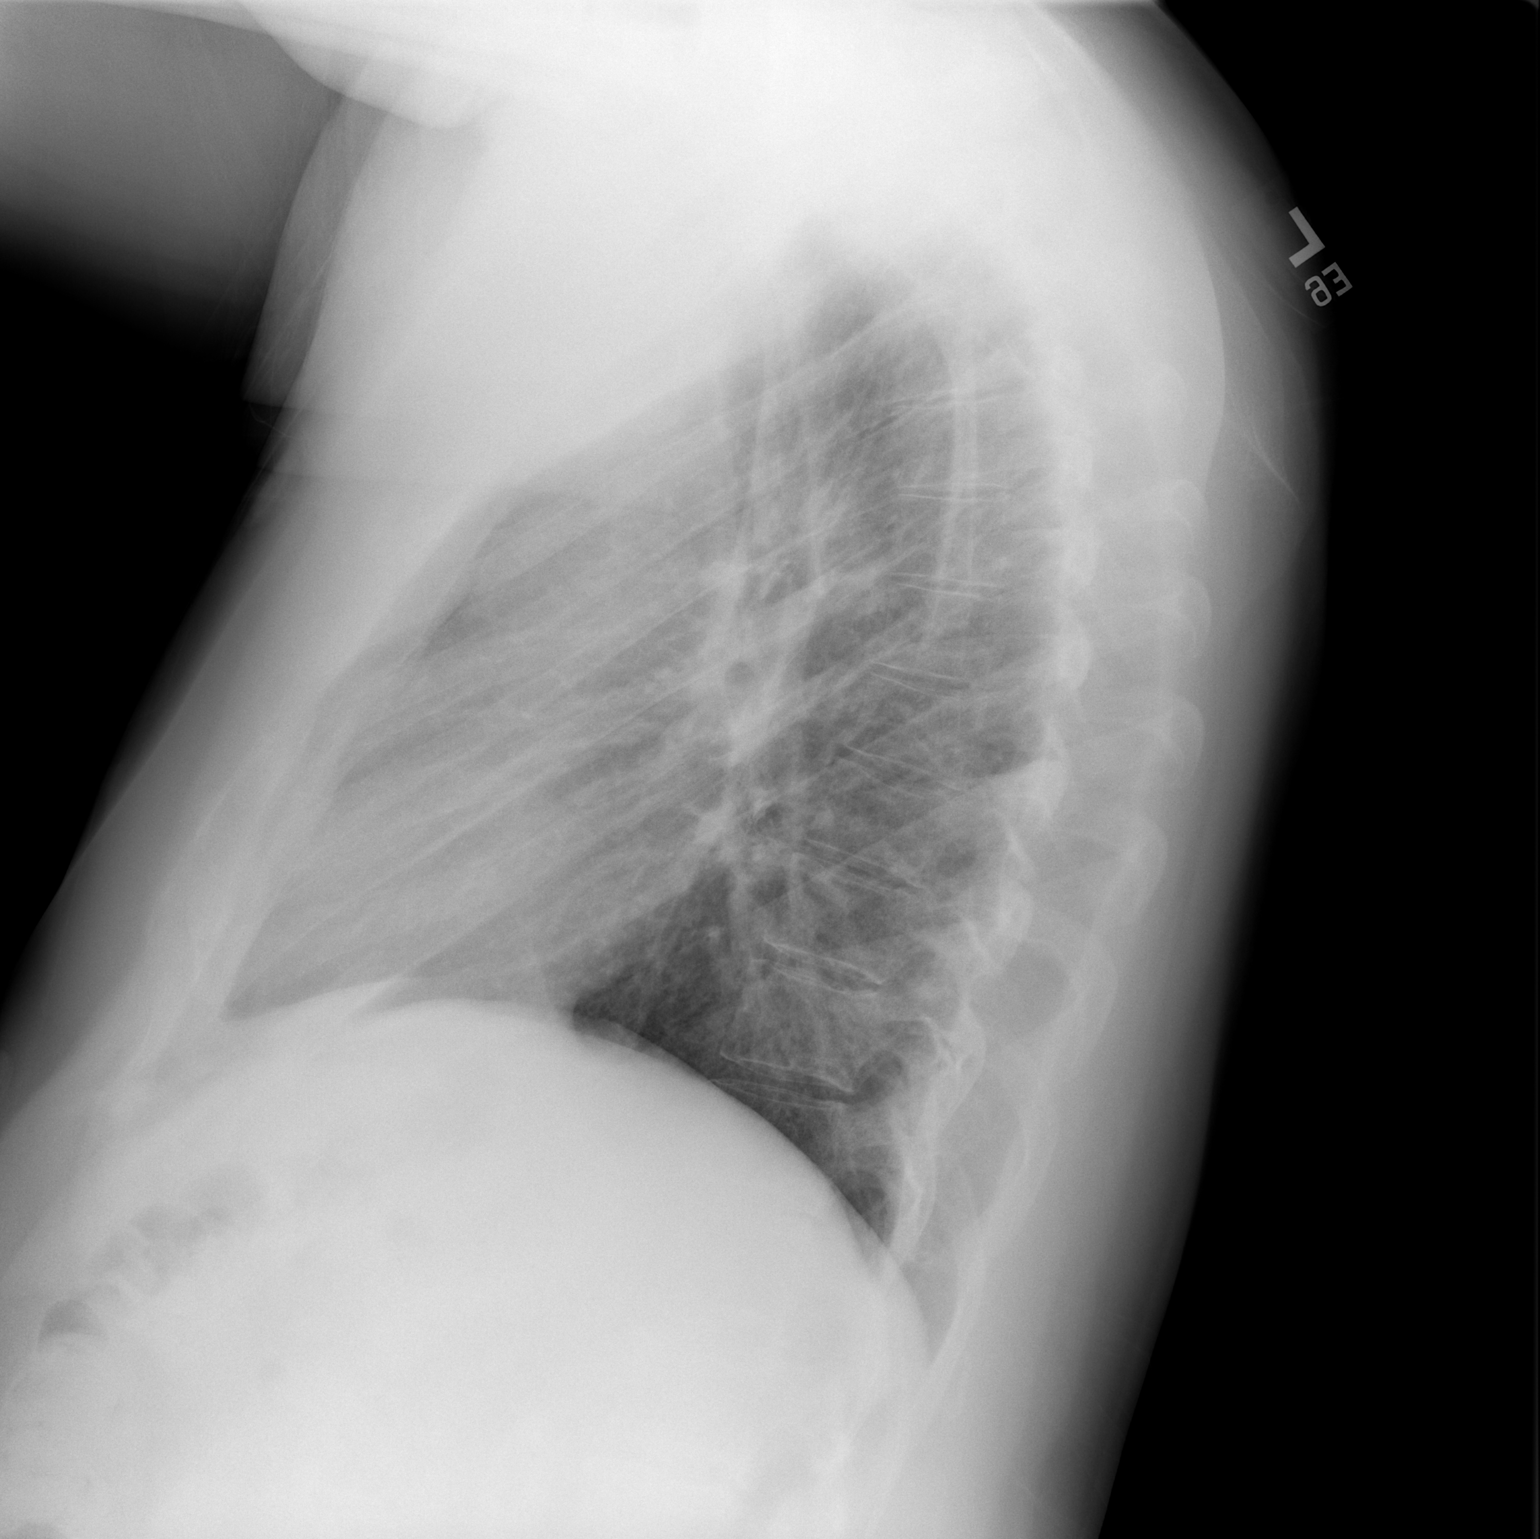

[2 of 2 positions shown; findings below may reference images not displayed]

FINDINGS: Cardiomediastinal silhouette and pulmonary vasculature are within
normal limits.

Lungs are clear.
IMPRESSION: No acute cardiopulmonary process.

## 2022-10-14 DIAGNOSIS — G4733 Obstructive sleep apnea (adult) (pediatric): Secondary | ICD-10-CM | POA: Diagnosis not present

## 2022-11-11 DIAGNOSIS — F411 Generalized anxiety disorder: Secondary | ICD-10-CM | POA: Diagnosis not present

## 2022-11-11 DIAGNOSIS — F331 Major depressive disorder, recurrent, moderate: Secondary | ICD-10-CM | POA: Diagnosis not present

## 2022-11-11 DIAGNOSIS — F410 Panic disorder [episodic paroxysmal anxiety] without agoraphobia: Secondary | ICD-10-CM | POA: Diagnosis not present

## 2022-12-15 DIAGNOSIS — G4733 Obstructive sleep apnea (adult) (pediatric): Secondary | ICD-10-CM | POA: Diagnosis not present

## 2023-01-12 DIAGNOSIS — G4733 Obstructive sleep apnea (adult) (pediatric): Secondary | ICD-10-CM | POA: Diagnosis not present

## 2023-02-17 DIAGNOSIS — F331 Major depressive disorder, recurrent, moderate: Secondary | ICD-10-CM | POA: Diagnosis not present

## 2023-02-17 DIAGNOSIS — F410 Panic disorder [episodic paroxysmal anxiety] without agoraphobia: Secondary | ICD-10-CM | POA: Diagnosis not present

## 2023-02-17 DIAGNOSIS — F5104 Psychophysiologic insomnia: Secondary | ICD-10-CM | POA: Diagnosis not present

## 2023-02-17 DIAGNOSIS — F411 Generalized anxiety disorder: Secondary | ICD-10-CM | POA: Diagnosis not present

## 2023-04-13 DIAGNOSIS — G4733 Obstructive sleep apnea (adult) (pediatric): Secondary | ICD-10-CM | POA: Diagnosis not present

## 2023-06-10 DIAGNOSIS — F411 Generalized anxiety disorder: Secondary | ICD-10-CM | POA: Diagnosis not present

## 2023-06-10 DIAGNOSIS — F331 Major depressive disorder, recurrent, moderate: Secondary | ICD-10-CM | POA: Diagnosis not present

## 2023-06-10 DIAGNOSIS — F5104 Psychophysiologic insomnia: Secondary | ICD-10-CM | POA: Diagnosis not present

## 2023-06-10 DIAGNOSIS — F410 Panic disorder [episodic paroxysmal anxiety] without agoraphobia: Secondary | ICD-10-CM | POA: Diagnosis not present

## 2023-06-15 DIAGNOSIS — G4733 Obstructive sleep apnea (adult) (pediatric): Secondary | ICD-10-CM | POA: Diagnosis not present

## 2023-07-14 DIAGNOSIS — G4733 Obstructive sleep apnea (adult) (pediatric): Secondary | ICD-10-CM | POA: Diagnosis not present

## 2023-07-21 DIAGNOSIS — F411 Generalized anxiety disorder: Secondary | ICD-10-CM | POA: Diagnosis not present

## 2023-07-21 DIAGNOSIS — F331 Major depressive disorder, recurrent, moderate: Secondary | ICD-10-CM | POA: Diagnosis not present

## 2023-07-29 DIAGNOSIS — Z0000 Encounter for general adult medical examination without abnormal findings: Secondary | ICD-10-CM | POA: Diagnosis not present

## 2023-07-29 DIAGNOSIS — F411 Generalized anxiety disorder: Secondary | ICD-10-CM | POA: Diagnosis not present

## 2023-07-29 DIAGNOSIS — E66811 Obesity, class 1: Secondary | ICD-10-CM | POA: Diagnosis not present

## 2023-07-29 DIAGNOSIS — G4733 Obstructive sleep apnea (adult) (pediatric): Secondary | ICD-10-CM | POA: Diagnosis not present

## 2023-07-30 DIAGNOSIS — Z13220 Encounter for screening for lipoid disorders: Secondary | ICD-10-CM | POA: Diagnosis not present

## 2023-07-30 DIAGNOSIS — Z0000 Encounter for general adult medical examination without abnormal findings: Secondary | ICD-10-CM | POA: Diagnosis not present

## 2023-08-10 DIAGNOSIS — G4733 Obstructive sleep apnea (adult) (pediatric): Secondary | ICD-10-CM | POA: Diagnosis not present

## 2023-08-12 DIAGNOSIS — F331 Major depressive disorder, recurrent, moderate: Secondary | ICD-10-CM | POA: Diagnosis not present

## 2023-08-12 DIAGNOSIS — F411 Generalized anxiety disorder: Secondary | ICD-10-CM | POA: Diagnosis not present

## 2023-08-18 DIAGNOSIS — F5104 Psychophysiologic insomnia: Secondary | ICD-10-CM | POA: Diagnosis not present

## 2023-08-18 DIAGNOSIS — F331 Major depressive disorder, recurrent, moderate: Secondary | ICD-10-CM | POA: Diagnosis not present

## 2023-08-18 DIAGNOSIS — R195 Other fecal abnormalities: Secondary | ICD-10-CM | POA: Diagnosis not present

## 2023-08-18 DIAGNOSIS — F410 Panic disorder [episodic paroxysmal anxiety] without agoraphobia: Secondary | ICD-10-CM | POA: Diagnosis not present

## 2023-08-18 DIAGNOSIS — F411 Generalized anxiety disorder: Secondary | ICD-10-CM | POA: Diagnosis not present

## 2023-08-18 DIAGNOSIS — K219 Gastro-esophageal reflux disease without esophagitis: Secondary | ICD-10-CM | POA: Diagnosis not present

## 2023-09-09 DIAGNOSIS — K2289 Other specified disease of esophagus: Secondary | ICD-10-CM | POA: Diagnosis not present

## 2023-09-09 DIAGNOSIS — K317 Polyp of stomach and duodenum: Secondary | ICD-10-CM | POA: Diagnosis not present

## 2023-09-09 DIAGNOSIS — K2970 Gastritis, unspecified, without bleeding: Secondary | ICD-10-CM | POA: Diagnosis not present

## 2023-09-09 DIAGNOSIS — K2100 Gastro-esophageal reflux disease with esophagitis, without bleeding: Secondary | ICD-10-CM | POA: Diagnosis not present

## 2023-09-09 DIAGNOSIS — K319 Disease of stomach and duodenum, unspecified: Secondary | ICD-10-CM | POA: Diagnosis not present

## 2023-09-09 DIAGNOSIS — D124 Benign neoplasm of descending colon: Secondary | ICD-10-CM | POA: Diagnosis not present

## 2023-09-09 DIAGNOSIS — R195 Other fecal abnormalities: Secondary | ICD-10-CM | POA: Diagnosis not present

## 2023-09-22 DIAGNOSIS — F411 Generalized anxiety disorder: Secondary | ICD-10-CM | POA: Diagnosis not present

## 2023-09-22 DIAGNOSIS — F410 Panic disorder [episodic paroxysmal anxiety] without agoraphobia: Secondary | ICD-10-CM | POA: Diagnosis not present

## 2023-09-22 DIAGNOSIS — F331 Major depressive disorder, recurrent, moderate: Secondary | ICD-10-CM | POA: Diagnosis not present

## 2023-09-22 DIAGNOSIS — F5104 Psychophysiologic insomnia: Secondary | ICD-10-CM | POA: Diagnosis not present

## 2023-10-12 DIAGNOSIS — G4733 Obstructive sleep apnea (adult) (pediatric): Secondary | ICD-10-CM | POA: Diagnosis not present

## 2023-10-20 DIAGNOSIS — F411 Generalized anxiety disorder: Secondary | ICD-10-CM | POA: Diagnosis not present

## 2023-11-05 DIAGNOSIS — F411 Generalized anxiety disorder: Secondary | ICD-10-CM | POA: Diagnosis not present

## 2023-11-09 DIAGNOSIS — G4733 Obstructive sleep apnea (adult) (pediatric): Secondary | ICD-10-CM | POA: Diagnosis not present

## 2023-11-11 DIAGNOSIS — F410 Panic disorder [episodic paroxysmal anxiety] without agoraphobia: Secondary | ICD-10-CM | POA: Diagnosis not present

## 2023-11-11 DIAGNOSIS — F331 Major depressive disorder, recurrent, moderate: Secondary | ICD-10-CM | POA: Diagnosis not present

## 2023-11-11 DIAGNOSIS — F5104 Psychophysiologic insomnia: Secondary | ICD-10-CM | POA: Diagnosis not present

## 2023-11-11 DIAGNOSIS — F411 Generalized anxiety disorder: Secondary | ICD-10-CM | POA: Diagnosis not present

## 2023-12-08 DIAGNOSIS — F331 Major depressive disorder, recurrent, moderate: Secondary | ICD-10-CM | POA: Diagnosis not present

## 2023-12-08 DIAGNOSIS — F5104 Psychophysiologic insomnia: Secondary | ICD-10-CM | POA: Diagnosis not present

## 2023-12-08 DIAGNOSIS — F410 Panic disorder [episodic paroxysmal anxiety] without agoraphobia: Secondary | ICD-10-CM | POA: Diagnosis not present

## 2023-12-08 DIAGNOSIS — F411 Generalized anxiety disorder: Secondary | ICD-10-CM | POA: Diagnosis not present

## 2023-12-08 DIAGNOSIS — K209 Esophagitis, unspecified without bleeding: Secondary | ICD-10-CM | POA: Diagnosis not present

## 2023-12-17 DIAGNOSIS — F411 Generalized anxiety disorder: Secondary | ICD-10-CM | POA: Diagnosis not present

## 2023-12-22 DIAGNOSIS — F411 Generalized anxiety disorder: Secondary | ICD-10-CM | POA: Diagnosis not present

## 2023-12-29 DIAGNOSIS — F411 Generalized anxiety disorder: Secondary | ICD-10-CM | POA: Diagnosis not present

## 2024-01-05 DIAGNOSIS — F411 Generalized anxiety disorder: Secondary | ICD-10-CM | POA: Diagnosis not present

## 2024-01-12 DIAGNOSIS — F411 Generalized anxiety disorder: Secondary | ICD-10-CM | POA: Diagnosis not present

## 2024-01-26 DIAGNOSIS — F411 Generalized anxiety disorder: Secondary | ICD-10-CM | POA: Diagnosis not present

## 2024-02-02 DIAGNOSIS — F411 Generalized anxiety disorder: Secondary | ICD-10-CM | POA: Diagnosis not present

## 2024-02-09 DIAGNOSIS — F411 Generalized anxiety disorder: Secondary | ICD-10-CM | POA: Diagnosis not present

## 2024-02-16 DIAGNOSIS — F411 Generalized anxiety disorder: Secondary | ICD-10-CM | POA: Diagnosis not present

## 2024-02-23 DIAGNOSIS — F411 Generalized anxiety disorder: Secondary | ICD-10-CM | POA: Diagnosis not present

## 2024-02-23 DIAGNOSIS — G4733 Obstructive sleep apnea (adult) (pediatric): Secondary | ICD-10-CM | POA: Diagnosis not present

## 2024-02-23 DIAGNOSIS — E66811 Obesity, class 1: Secondary | ICD-10-CM | POA: Diagnosis not present

## 2024-02-23 DIAGNOSIS — F419 Anxiety disorder, unspecified: Secondary | ICD-10-CM | POA: Diagnosis not present

## 2024-03-01 DIAGNOSIS — F411 Generalized anxiety disorder: Secondary | ICD-10-CM | POA: Diagnosis not present

## 2024-03-08 DIAGNOSIS — F331 Major depressive disorder, recurrent, moderate: Secondary | ICD-10-CM | POA: Diagnosis not present

## 2024-03-08 DIAGNOSIS — F411 Generalized anxiety disorder: Secondary | ICD-10-CM | POA: Diagnosis not present

## 2024-03-08 DIAGNOSIS — F5104 Psychophysiologic insomnia: Secondary | ICD-10-CM | POA: Diagnosis not present

## 2024-03-08 DIAGNOSIS — F410 Panic disorder [episodic paroxysmal anxiety] without agoraphobia: Secondary | ICD-10-CM | POA: Diagnosis not present

## 2024-03-09 DIAGNOSIS — M5441 Lumbago with sciatica, right side: Secondary | ICD-10-CM | POA: Diagnosis not present

## 2024-03-10 DIAGNOSIS — G4733 Obstructive sleep apnea (adult) (pediatric): Secondary | ICD-10-CM | POA: Diagnosis not present

## 2024-03-24 DIAGNOSIS — M5416 Radiculopathy, lumbar region: Secondary | ICD-10-CM | POA: Diagnosis not present

## 2024-03-30 DIAGNOSIS — M545 Low back pain, unspecified: Secondary | ICD-10-CM | POA: Diagnosis not present

## 2024-04-10 DIAGNOSIS — G4733 Obstructive sleep apnea (adult) (pediatric): Secondary | ICD-10-CM | POA: Diagnosis not present

## 2024-04-12 DIAGNOSIS — F411 Generalized anxiety disorder: Secondary | ICD-10-CM | POA: Diagnosis not present

## 2024-04-14 DIAGNOSIS — M5416 Radiculopathy, lumbar region: Secondary | ICD-10-CM | POA: Diagnosis not present

## 2024-04-19 DIAGNOSIS — F411 Generalized anxiety disorder: Secondary | ICD-10-CM | POA: Diagnosis not present

## 2024-04-27 DIAGNOSIS — M5416 Radiculopathy, lumbar region: Secondary | ICD-10-CM | POA: Diagnosis not present

## 2024-05-03 DIAGNOSIS — F411 Generalized anxiety disorder: Secondary | ICD-10-CM | POA: Diagnosis not present

## 2024-05-10 DIAGNOSIS — F411 Generalized anxiety disorder: Secondary | ICD-10-CM | POA: Diagnosis not present

## 2024-05-17 DIAGNOSIS — F411 Generalized anxiety disorder: Secondary | ICD-10-CM | POA: Diagnosis not present

## 2024-05-19 DIAGNOSIS — M5416 Radiculopathy, lumbar region: Secondary | ICD-10-CM | POA: Diagnosis not present
